# Patient Record
Sex: Male | Born: 1950 | Race: White | Hispanic: No
Health system: Southern US, Community
[De-identification: ages and names within clinical notes are randomized; demographics above are authoritative.]

---

## 2007-09-25 ENCOUNTER — Encounter: Admission: RE | Admit: 2007-09-25 | Discharge: 2007-09-25 | Payer: Self-pay | Admitting: Family Medicine

## 2013-03-06 ENCOUNTER — Emergency Department (HOSPITAL_COMMUNITY)
Admission: EM | Admit: 2013-03-06 | Discharge: 2013-03-06 | Disposition: A | Discharge status: Home / Self Care | Payer: 59 | Attending: Emergency Medicine | Admitting: Emergency Medicine

## 2013-03-06 ENCOUNTER — Emergency Department (HOSPITAL_COMMUNITY): Payer: 59

## 2013-03-06 DIAGNOSIS — Y92410 Unspecified street and highway as the place of occurrence of the external cause: Secondary | ICD-10-CM | POA: Insufficient documentation

## 2013-03-06 DIAGNOSIS — S058X9A Other injuries of unspecified eye and orbit, initial encounter: Secondary | ICD-10-CM | POA: Insufficient documentation

## 2013-03-06 DIAGNOSIS — S0230XA Fracture of orbital floor, unspecified side, initial encounter for closed fracture: Secondary | ICD-10-CM

## 2013-03-06 DIAGNOSIS — Z880 Allergy status to penicillin: Secondary | ICD-10-CM | POA: Insufficient documentation

## 2013-03-06 DIAGNOSIS — S0502XA Injury of conjunctiva and corneal abrasion without foreign body, left eye, initial encounter: Secondary | ICD-10-CM

## 2013-03-06 DIAGNOSIS — Y9389 Activity, other specified: Secondary | ICD-10-CM | POA: Insufficient documentation

## 2013-03-06 DIAGNOSIS — Z79899 Other long term (current) drug therapy: Secondary | ICD-10-CM | POA: Insufficient documentation

## 2013-03-06 MED ORDER — LIDOCAINE HCL 2 % IJ SOLN
INTRAMUSCULAR | Status: AC
  Administered 2013-03-06: 5 mL via INTRADERMAL
  Filled 2013-03-06: qty 20

## 2013-03-06 MED ORDER — LIDOCAINE HCL 2 % IJ SOLN
5.0000 mL | Freq: Once | INTRAMUSCULAR | Status: AC
  Administered 2013-03-06: 5 mL via INTRADERMAL

## 2013-03-06 MED ORDER — POLYMYXIN B-TRIMETHOPRIM 10000-0.1 UNIT/ML-% OP SOLN: 1.0000 [drp] | Freq: Four times a day (QID) | OPHTHALMIC | Status: AC

## 2013-03-06 MED ORDER — HYDROCODONE-ACETAMINOPHEN 5-325 MG PO TABS: 1.0000 | ORAL_TABLET | Freq: Four times a day (QID) | ORAL | Status: AC | PRN

## 2013-03-06 MED ORDER — FLUORESCEIN SODIUM 1 MG OP STRP
1.0000 | ORAL_STRIP | Freq: Once | OPHTHALMIC | Status: AC
  Administered 2013-03-06: 1 via OPHTHALMIC
  Filled 2013-03-06: qty 1

## 2013-03-06 MED ORDER — LIDOCAINE-EPINEPHRINE (PF) 2 %-1:200000 IJ SOLN
INTRAMUSCULAR | Status: AC
  Filled 2013-03-06: qty 10

## 2013-03-06 MED ORDER — TETRACAINE HCL 0.5 % OP SOLN
2.0000 [drp] | Freq: Once | OPHTHALMIC | Status: AC
  Administered 2013-03-06: 2 [drp] via OPHTHALMIC
  Filled 2013-03-06: qty 2
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
## 2013-03-06 NOTE — Discharge Instructions (Signed)
Call and make follow up appointment with Opthalmology in 24-48 hours for reevaluation. Use antibiotic drops in affected eye as directed. Use cool compresses to affected eye. Take ibuprofen OTC as directed on the bottle for swelling / pain. Return to ED if you develop any worsening pain or visual changes.   Call and make follow up appointment with Maxillofacial specialist for orbital floor fracture in 2 days. Take pain prescription pain medication as directed. Do not drive with pain medication. Return to ED if you develop worsening eye pain with eye movement or become unable to move your eye. Avoid sneezing or coughing. Apply ice packs to swollen area in 15 min intervals.

## 2013-03-06 NOTE — ED Notes (Signed)
Pt returns to room 
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
## 2013-07-23 ENCOUNTER — Ambulatory Visit: Payer: 59 | Admitting: Neurology
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
